# Patient Record
Sex: Male | Born: 1965 | Hispanic: No | Marital: Single | Smoking: Never smoker
Health system: Southern US, Community
[De-identification: ages and names within clinical notes are randomized; demographics above are authoritative.]

## PROBLEM LIST (undated history)

## (undated) DIAGNOSIS — B20 Human immunodeficiency virus [HIV] disease: Secondary | ICD-10-CM

## (undated) DIAGNOSIS — IMO0002 Reserved for concepts with insufficient information to code with codable children: Secondary | ICD-10-CM

---

## 2001-11-19 ENCOUNTER — Emergency Department (HOSPITAL_COMMUNITY): Admission: EM | Admit: 2001-11-19 | Discharge: 2001-11-19 | Payer: Self-pay

## 2001-11-24 ENCOUNTER — Encounter: Payer: Self-pay | Admitting: Emergency Medicine

## 2001-11-24 ENCOUNTER — Emergency Department (HOSPITAL_COMMUNITY): Admission: EM | Admit: 2001-11-24 | Discharge: 2001-11-24 | Payer: Self-pay | Admitting: Emergency Medicine

## 2003-02-20 ENCOUNTER — Encounter: Admission: RE | Admit: 2003-02-20 | Discharge: 2003-02-20 | Payer: Self-pay | Admitting: Family Medicine

## 2013-04-25 ENCOUNTER — Emergency Department (HOSPITAL_BASED_OUTPATIENT_CLINIC_OR_DEPARTMENT_OTHER)
Admission: EM | Admit: 2013-04-25 | Discharge: 2013-04-25 | Disposition: A | Discharge status: Home / Self Care | Payer: No Typology Code available for payment source | Attending: Emergency Medicine | Admitting: Emergency Medicine

## 2013-04-25 ENCOUNTER — Encounter (HOSPITAL_BASED_OUTPATIENT_CLINIC_OR_DEPARTMENT_OTHER): Payer: Self-pay | Admitting: Emergency Medicine

## 2013-04-25 ENCOUNTER — Emergency Department (HOSPITAL_BASED_OUTPATIENT_CLINIC_OR_DEPARTMENT_OTHER): Payer: No Typology Code available for payment source

## 2013-04-25 DIAGNOSIS — R55 Syncope and collapse: Secondary | ICD-10-CM | POA: Insufficient documentation

## 2013-04-25 DIAGNOSIS — S0003XA Contusion of scalp, initial encounter: Secondary | ICD-10-CM

## 2013-04-25 DIAGNOSIS — R51 Headache: Secondary | ICD-10-CM | POA: Insufficient documentation

## 2013-04-25 DIAGNOSIS — Z21 Asymptomatic human immunodeficiency virus [HIV] infection status: Secondary | ICD-10-CM | POA: Insufficient documentation

## 2013-04-25 DIAGNOSIS — Z79899 Other long term (current) drug therapy: Secondary | ICD-10-CM | POA: Insufficient documentation

## 2013-04-25 DIAGNOSIS — Z8739 Personal history of other diseases of the musculoskeletal system and connective tissue: Secondary | ICD-10-CM | POA: Insufficient documentation

## 2013-04-25 DIAGNOSIS — G8911 Acute pain due to trauma: Secondary | ICD-10-CM | POA: Insufficient documentation

## 2013-04-25 DIAGNOSIS — S20219A Contusion of unspecified front wall of thorax, initial encounter: Secondary | ICD-10-CM

## 2013-04-25 DIAGNOSIS — R071 Chest pain on breathing: Secondary | ICD-10-CM | POA: Insufficient documentation

## 2013-04-25 HISTORY — DX: Human immunodeficiency virus (HIV) disease: B20

## 2013-04-25 HISTORY — DX: Reserved for concepts with insufficient information to code with codable children: IMO0002

## 2013-04-25 MED ORDER — METHOCARBAMOL 500 MG PO TABS: 500.0000 mg | ORAL_TABLET | Freq: Three times a day (TID) | ORAL | Status: AC | PRN
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
## 2013-04-25 NOTE — Discharge Instructions (Signed)
Chest Contusion °A chest contusion is a deep bruise on your chest area. Contusions are the result of an injury that caused bleeding under the skin. A chest contusion may involve bruising of the skin, muscles, or ribs. The contusion may turn blue, purple, or yellow. Minor injuries will give you a painless contusion, but more severe contusions may stay painful and swollen for a few weeks. °CAUSES  °A contusion is usually caused by a blow, trauma, or direct force to an area of the body. °SYMPTOMS  °· Swelling and redness of the injured area. °· Discoloration of the injured area. °· Tenderness and soreness of the injured area. °· Pain. °DIAGNOSIS  °The diagnosis can be made by taking a history and performing a physical exam. An X-ray, CT scan, or MRI may be needed to determine if there were any associated injuries, such as broken bones (fractures) or internal injuries. °TREATMENT  °Often, the best treatment for a chest contusion is resting, icing, and applying cold compresses to the injured area. Deep breathing exercises may be recommended to reduce the risk of pneumonia. Over-the-counter medicines may also be recommended for pain control. °HOME CARE INSTRUCTIONS  °· Put ice on the injured area. °· Put ice in a plastic bag. °· Place a towel between your skin and the bag. °· Leave the ice on for 15-20 minutes, 03-04 times a day. °· Only take over-the-counter or prescription medicines as directed by your caregiver. Your caregiver may recommend avoiding anti-inflammatory medicines (aspirin, ibuprofen, and naproxen) for 48 hours because these medicines may increase bruising. °· Rest the injured area. °· Perform deep-breathing exercises as directed by your caregiver. °· Stop smoking if you smoke. °· Do not lift objects over 5 pounds (2.3 kg) for 3 days or longer if recommended by your caregiver. °SEEK IMMEDIATE MEDICAL CARE IF:  °· You have increased bruising or swelling. °· You have pain that is getting worse. °· You have  difficulty breathing. °· You have dizziness, weakness, or fainting. °· You have blood in your urine or stool. °· You cough up or vomit blood. °· Your swelling or pain is not relieved with medicines. °MAKE SURE YOU:  °· Understand these instructions. °· Will watch your condition. °· Will get help right away if you are not doing well or get worse. °Document Released: 11/03/2000 Document Revised: 11/03/2011 Document Reviewed: 08/02/2011 °[employer]® Patient Information ©2014 [employer]. ° °
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
## 2013-09-22 DEATH — deceased
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
